# Patient Record
Sex: Male | Born: 1945 | Race: Black or African American | Hispanic: No | Marital: Married
Health system: Southern US, Community
[De-identification: ages and names within clinical notes are randomized; demographics above are authoritative.]

## PROBLEM LIST (undated history)

## (undated) DIAGNOSIS — M1990 Unspecified osteoarthritis, unspecified site: Secondary | ICD-10-CM

## (undated) DIAGNOSIS — Z2239 Carrier of other specified bacterial diseases: Secondary | ICD-10-CM

## (undated) DIAGNOSIS — I4891 Unspecified atrial fibrillation: Secondary | ICD-10-CM

## (undated) DIAGNOSIS — S88911A Complete traumatic amputation of right lower leg, level unspecified, initial encounter: Secondary | ICD-10-CM

## (undated) DIAGNOSIS — B1910 Unspecified viral hepatitis B without hepatic coma: Secondary | ICD-10-CM

## (undated) DIAGNOSIS — D649 Anemia, unspecified: Secondary | ICD-10-CM

## (undated) DIAGNOSIS — B1920 Unspecified viral hepatitis C without hepatic coma: Secondary | ICD-10-CM

## (undated) DIAGNOSIS — C801 Malignant (primary) neoplasm, unspecified: Secondary | ICD-10-CM

## (undated) DIAGNOSIS — I639 Cerebral infarction, unspecified: Secondary | ICD-10-CM

## (undated) DIAGNOSIS — E875 Hyperkalemia: Secondary | ICD-10-CM

## (undated) DIAGNOSIS — N189 Chronic kidney disease, unspecified: Secondary | ICD-10-CM

## (undated) DIAGNOSIS — I499 Cardiac arrhythmia, unspecified: Secondary | ICD-10-CM

## (undated) DIAGNOSIS — I509 Heart failure, unspecified: Secondary | ICD-10-CM

## (undated) DIAGNOSIS — I10 Essential (primary) hypertension: Secondary | ICD-10-CM

## (undated) DIAGNOSIS — I739 Peripheral vascular disease, unspecified: Secondary | ICD-10-CM

## (undated) DIAGNOSIS — K759 Inflammatory liver disease, unspecified: Secondary | ICD-10-CM

## (undated) DIAGNOSIS — E119 Type 2 diabetes mellitus without complications: Secondary | ICD-10-CM

## (undated) DIAGNOSIS — A4902 Methicillin resistant Staphylococcus aureus infection, unspecified site: Secondary | ICD-10-CM

## (undated) HISTORY — PX: CARDIAC DEFIBRILLATOR PLACEMENT: SHX171

## (undated) HISTORY — PX: ABOVE KNEE LEG AMPUTATION: SUR20

## (undated) HISTORY — PX: AV FISTULA PLACEMENT: SHX1204

## (undated) HISTORY — PX: LEG AMPUTATION BELOW KNEE: SHX694

## (undated) SURGERY — Surgical Case
Anesthesia: *Unknown

---

## 2014-10-17 ENCOUNTER — Ambulatory Visit (HOSPITAL_COMMUNITY)
Admission: AD | Admit: 2014-10-17 | Payer: Medicare Other | Source: Other Acute Inpatient Hospital | Admitting: Internal Medicine

## 2014-10-17 ENCOUNTER — Ambulatory Visit (HOSPITAL_COMMUNITY)
Admission: RE | Admit: 2014-10-17 | Discharge: 2014-10-17 | Disposition: A | Discharge status: Home / Self Care | Payer: Medicare Other | Source: Ambulatory Visit | Attending: Internal Medicine | Admitting: Internal Medicine

## 2014-10-17 ENCOUNTER — Other Ambulatory Visit (HOSPITAL_COMMUNITY): Payer: Self-pay

## 2014-10-17 DIAGNOSIS — R4182 Altered mental status, unspecified: Secondary | ICD-10-CM | POA: Diagnosis present

## 2014-10-17 DIAGNOSIS — G319 Degenerative disease of nervous system, unspecified: Secondary | ICD-10-CM | POA: Diagnosis not present

## 2014-12-16 ENCOUNTER — Encounter: Payer: Self-pay | Admitting: *Deleted

## 2014-12-16 ENCOUNTER — Encounter
Admission: RE | Disposition: A | Discharge status: Skilled Nursing Facility | Payer: Self-pay | Source: Ambulatory Visit | Attending: Vascular Surgery

## 2014-12-16 ENCOUNTER — Ambulatory Visit
Admission: RE | Admit: 2014-12-16 | Discharge: 2014-12-16 | Disposition: A | Discharge status: Skilled Nursing Facility | Payer: Medicare Other | Source: Ambulatory Visit | Attending: Vascular Surgery | Admitting: Vascular Surgery

## 2014-12-16 DIAGNOSIS — I120 Hypertensive chronic kidney disease with stage 5 chronic kidney disease or end stage renal disease: Secondary | ICD-10-CM | POA: Insufficient documentation

## 2014-12-16 DIAGNOSIS — T82858A Stenosis of vascular prosthetic devices, implants and grafts, initial encounter: Secondary | ICD-10-CM | POA: Diagnosis present

## 2014-12-16 DIAGNOSIS — E1122 Type 2 diabetes mellitus with diabetic chronic kidney disease: Secondary | ICD-10-CM | POA: Insufficient documentation

## 2014-12-16 DIAGNOSIS — I499 Cardiac arrhythmia, unspecified: Secondary | ICD-10-CM | POA: Insufficient documentation

## 2014-12-16 DIAGNOSIS — Z86718 Personal history of other venous thrombosis and embolism: Secondary | ICD-10-CM | POA: Diagnosis not present

## 2014-12-16 DIAGNOSIS — Z7982 Long term (current) use of aspirin: Secondary | ICD-10-CM | POA: Insufficient documentation

## 2014-12-16 DIAGNOSIS — Z992 Dependence on renal dialysis: Secondary | ICD-10-CM | POA: Diagnosis not present

## 2014-12-16 DIAGNOSIS — I509 Heart failure, unspecified: Secondary | ICD-10-CM | POA: Insufficient documentation

## 2014-12-16 DIAGNOSIS — T829XXA Unspecified complication of cardiac and vascular prosthetic device, implant and graft, initial encounter: Secondary | ICD-10-CM

## 2014-12-16 DIAGNOSIS — Z8673 Personal history of transient ischemic attack (TIA), and cerebral infarction without residual deficits: Secondary | ICD-10-CM | POA: Diagnosis not present

## 2014-12-16 DIAGNOSIS — Z79899 Other long term (current) drug therapy: Secondary | ICD-10-CM | POA: Insufficient documentation

## 2014-12-16 DIAGNOSIS — N186 End stage renal disease: Secondary | ICD-10-CM | POA: Insufficient documentation

## 2014-12-16 HISTORY — DX: Type 2 diabetes mellitus without complications: E11.9

## 2014-12-16 HISTORY — DX: Methicillin resistant Staphylococcus aureus infection, unspecified site: A49.02

## 2014-12-16 HISTORY — DX: Inflammatory liver disease, unspecified: K75.9

## 2014-12-16 HISTORY — DX: Peripheral vascular disease, unspecified: I73.9

## 2014-12-16 HISTORY — DX: Chronic kidney disease, unspecified: N18.9

## 2014-12-16 HISTORY — DX: Malignant (primary) neoplasm, unspecified: C80.1

## 2014-12-16 HISTORY — DX: Carrier of other specified bacterial diseases: Z22.39

## 2014-12-16 HISTORY — DX: Complete traumatic amputation of right lower leg, level unspecified, initial encounter: S88.911A

## 2014-12-16 HISTORY — DX: Cerebral infarction, unspecified: I63.9

## 2014-12-16 HISTORY — DX: Cardiac arrhythmia, unspecified: I49.9

## 2014-12-16 HISTORY — DX: Anemia, unspecified: D64.9

## 2014-12-16 HISTORY — PX: PERIPHERAL VASCULAR CATHETERIZATION: SHX172C

## 2014-12-16 LAB — GLUCOSE, CAPILLARY: Glucose-Capillary: 114 mg/dL — ABNORMAL HIGH (ref 65–99)

## 2014-12-16 LAB — POTASSIUM (ARMC VASCULAR LAB ONLY): POTASSIUM (ARMC VASCULAR LAB): 4.5

## 2014-12-16 SURGERY — DIALYSIS/PERMA CATHETER INSERTION
Anesthesia: Moderate Sedation | Laterality: Left

## 2014-12-16 SURGERY — A/V SHUNTOGRAM/FISTULAGRAM
Anesthesia: Moderate Sedation

## 2014-12-16 MED ORDER — PHENOL 1.4 % MT LIQD: 1.0000 | OROMUCOSAL | Status: DC | PRN

## 2014-12-16 MED ORDER — MIDAZOLAM HCL 5 MG/5ML IJ SOLN
INTRAMUSCULAR | Status: AC
  Filled 2014-12-16: qty 5

## 2014-12-16 MED ORDER — SODIUM CHLORIDE 0.9 % IV SOLN
INTRAVENOUS | Status: DC
  Administered 2014-12-16: 15:00:00 via INTRAVENOUS

## 2014-12-16 MED ORDER — IOHEXOL 300 MG/ML  SOLN
INTRAMUSCULAR | Status: DC | PRN
  Administered 2014-12-16: 30 mL via INTRAVENOUS

## 2014-12-16 MED ORDER — METOPROLOL TARTRATE 1 MG/ML IV SOLN: 2.0000 mg | INTRAVENOUS | Status: DC | PRN

## 2014-12-16 MED ORDER — GUAIFENESIN-DM 100-10 MG/5ML PO SYRP: 15.0000 mL | ORAL_SOLUTION | ORAL | Status: DC | PRN

## 2014-12-16 MED ORDER — ATROPINE SULFATE 0.1 MG/ML IJ SOLN: 0.5000 mg | Freq: Once | INTRAMUSCULAR | Status: DC | PRN

## 2014-12-16 MED ORDER — ONDANSETRON HCL 4 MG/2ML IJ SOLN: 4.0000 mg | Freq: Once | INTRAMUSCULAR | Status: DC | PRN

## 2014-12-16 MED ORDER — MIDAZOLAM HCL 2 MG/2ML IJ SOLN
INTRAMUSCULAR | Status: DC | PRN
  Administered 2014-12-16 (×2): 2 mg via INTRAVENOUS

## 2014-12-16 MED ORDER — LIDOCAINE HCL (PF) 1 % IJ SOLN
INTRAMUSCULAR | Status: DC | PRN
  Administered 2014-12-16: 10 mL

## 2014-12-16 MED ORDER — HEPARIN SODIUM (PORCINE) 1000 UNIT/ML IJ SOLN
INTRAMUSCULAR | Status: AC
  Filled 2014-12-16: qty 1

## 2014-12-16 MED ORDER — FENTANYL CITRATE (PF) 100 MCG/2ML IJ SOLN
INTRAMUSCULAR | Status: AC
  Filled 2014-12-16: qty 2

## 2014-12-16 MED ORDER — SODIUM CHLORIDE 0.9 % IV SOLN: 500.0000 mL | Freq: Once | INTRAVENOUS | Status: DC | PRN

## 2014-12-16 MED ORDER — ACETAMINOPHEN 325 MG RE SUPP: 325.0000 mg | RECTAL | Status: DC | PRN

## 2014-12-16 MED ORDER — FENTANYL CITRATE (PF) 100 MCG/2ML IJ SOLN
INTRAMUSCULAR | Status: DC | PRN
  Administered 2014-12-16 (×2): 50 ug via INTRAVENOUS

## 2014-12-16 MED ORDER — ACETAMINOPHEN 325 MG PO TABS: 325.0000 mg | ORAL_TABLET | ORAL | Status: DC | PRN

## 2014-12-16 MED ORDER — MORPHINE SULFATE 4 MG/ML IJ SOLN: 2.0000 mg | INTRAMUSCULAR | Status: DC | PRN

## 2014-12-16 MED ORDER — HEPARIN SODIUM (PORCINE) 10000 UNIT/ML IJ SOLN
INTRAMUSCULAR | Status: AC
  Filled 2014-12-16: qty 1

## 2014-12-16 MED ORDER — CEFAZOLIN SODIUM 1-5 GM-% IV SOLN
1.0000 g | Freq: Once | INTRAVENOUS | Status: AC
  Administered 2014-12-16: 1 g via INTRAVENOUS

## 2014-12-16 MED ORDER — OXYCODONE-ACETAMINOPHEN 5-325 MG PO TABS: 1.0000 | ORAL_TABLET | ORAL | Status: DC | PRN

## 2014-12-16 MED ORDER — HYDRALAZINE HCL 20 MG/ML IJ SOLN: 5.0000 mg | INTRAMUSCULAR | Status: DC | PRN

## 2014-12-16 MED ORDER — LABETALOL HCL 5 MG/ML IV SOLN: 10.0000 mg | INTRAVENOUS | Status: DC | PRN

## 2014-12-16 MED ORDER — ONDANSETRON HCL 4 MG/2ML IJ SOLN: 4.0000 mg | Freq: Four times a day (QID) | INTRAMUSCULAR | Status: DC | PRN

## 2014-12-16 MED ORDER — HYDROMORPHONE HCL 1 MG/ML IJ SOLN: 1.0000 mg | Freq: Once | INTRAMUSCULAR | Status: DC | PRN

## 2014-12-16 MED ORDER — LIDOCAINE-EPINEPHRINE (PF) 1 %-1:200000 IJ SOLN
INTRAMUSCULAR | Status: AC
  Filled 2014-12-16: qty 30

## 2014-12-16 MED ORDER — ALUM & MAG HYDROXIDE-SIMETH 200-200-20 MG/5ML PO SUSP: 15.0000 mL | ORAL | Status: DC | PRN

## 2014-12-16 MED ORDER — HEPARIN (PORCINE) IN NACL 2-0.9 UNIT/ML-% IJ SOLN
INTRAMUSCULAR | Status: AC
  Filled 2014-12-16: qty 1000

## 2014-12-16 SURGICAL SUPPLY — 12 items
BALLN DORADO 8X60X80 (BALLOONS) ×5
BALLN LUTONIX DCB 7X40X130 (BALLOONS) ×5
BALLOON DORADO 8X60X80 (BALLOONS) ×3 IMPLANT
BALLOON LUTONIX DCB 7X40X130 (BALLOONS) ×3 IMPLANT
CATH CANNON HEMO 15FR 32 (HEMODIALYSIS SUPPLIES) IMPLANT
CATH CANNON HEMO 15FR 32CM (HEMODIALYSIS SUPPLIES) ×5 IMPLANT
DEVICE PRESTO INFLATION (MISCELLANEOUS) ×3 IMPLANT
KIT 5FR STIFF NT/TG (MISCELLANEOUS) ×5 IMPLANT
PACK ANGIOGRAPHY (CUSTOM PROCEDURE TRAY) ×5 IMPLANT
SHEATH BRITE TIP 6FRX5.5 (SHEATH) ×5 IMPLANT
TOWEL OR 17X26 4PK STRL BLUE (TOWEL DISPOSABLE) ×5 IMPLANT
WIRE MAGIC TOR.035 180C (WIRE) ×5 IMPLANT

## 2014-12-16 NOTE — H&P (Signed)
[facility] History & Physical Update  The patient was interviewed and re-examined.  The patient's previous History and Physical has been reviewed and is unchanged.  There is no change in the plan of care.  Ina Scrivens, MD  12/16/2014, 1:42 PM

## 2014-12-16 NOTE — CV Procedure (Signed)
OPERATIVE NOTE    PRE-OPERATIVE DIAGNOSIS: 1. ESRD 2. Aneurysmal left brachiobasilic AV fistula that will require surgical revision and use of a tunneled catheter for several weeks while this heals  POST-OPERATIVE DIAGNOSIS: same as above  PROCEDURE: 1. Ultrasound guidance for vascular access to the left internal jugular vein 2. Fluoroscopic guidance for placement of catheter 3. Placement of a 27 cm tip to cuff tunneled hemodialysis catheter via the left internal jugular vein  SURGEON: Leotis Pain, MD  ANESTHESIA:  Local/MCS  ESTIMATED BLOOD LOSS: 25 cc  FINDING(S): 1.  Patent left internal jugular vein  SPECIMEN(S):  None  INDICATIONS:   Guy Brown is a 69 y.o. male who presents with an aneurysmal left arm AV fistula that will need a surgical revision. Fistulogram was also performed today as part of his evaluation for surgical revision.  The patient needs dialysis access for their ESRD while the surgical revision for his fistula is healing for several weeks, and a Permcath is necessary.  Risks and benefits are discussed and informed consent is obtained.    DESCRIPTION: After obtaining full informed written consent, the patient was brought back to the vascular suited. The patient's left neck and chest were sterilely prepped and draped in a sterile surgical field was created.  The left internal jugular vein was visualized with ultrasound and found to be patent. It was then accessed under direct ultrasound guidance and a permanent image was recorded. A wire was placed. After skin nick and dilatation, the peel-away sheath was placed over the wire. I then turned my attention to an area under the clavicle. Approximately 1-2 fingerbreadths below the clavicle a small counterincision was created and tunneled from the subclavicular incision to the access site. Using fluoroscopic guidance, a 27 centimeter tip to cuff tunneled hemodialysis catheter was selected, and tunneled from the  subclavicular incision to the access site. It was then placed through the peel-away sheath and the peel-away sheath was removed. Using fluoroscopic guidance the catheter tips were parked in the right atrium. The appropriate distal connectors were placed. It withdrew blood well and flushed easily with heparinized saline and a concentrated heparin solution was then placed. It was secured to the chest wall with 2 Prolene sutures. The access incision was closed single 4-0 Monocryl. A 4-0 Monocryl pursestring suture was placed around the exit site. Sterile dressings were placed. The patient tolerated the procedure well and was taken to the recovery room in stable condition.  COMPLICATIONS: None  CONDITION: Stable  [name]  12/16/2014, 4:31 PM

## 2014-12-16 NOTE — CV Procedure (Signed)
[facility]  OPERATIVE NOTE   PROCEDURE: 1.   Left brachiobasilic arteriovenous fistula cannulation under ultrasound guidance 2.   Left arm fistulagram including central venogram 3.   Percutaneous transluminal angioplasty of left basilic vein near basilic vein axillary vein confluence with 7 mm diameter drug-coated angioplasty balloon and 8 mm diameter high pressure angioplasty balloon  PRE-OPERATIVE DIAGNOSIS: 1. ESRD 2. Poorly functional and aneurysmal left brachiobasilic AVF  POST-OPERATIVE DIAGNOSIS: same as above   SURGEON: Leotis Pain, MD  ANESTHESIA: local with MCS  ESTIMATED BLOOD LOSS: 25 cc  FINDING(S): 1. 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 male who presents with malfunctioning  and aneurysmal left brachiobasilic arteriovenous fistula. He has had significant, life-threatening bleeding. The patient is scheduled for  left arm fistulagram and subsequent surgical revision. Fistulogram was performed to evaluate the fistula and to ensure that surgical revision will be safe and reasonable.  The patient is aware the risks include but are not limited to: bleeding, infection, thrombosis of the cannulated access, and possible anaphylactic reaction to the contrast.  The patient is aware of the risks of the procedure and elects to proceed forward.  DESCRIPTION: After full informed written consent was obtained, the patient was brought back to the angiography suite and placed supine upon the angiography table.  The patient was connected to monitoring equipment.  The  left arm was prepped and draped in the standard fashion for a percutaneous access intervention.  Under ultrasound guidance, the  left brachiobasilic arteriovenous fistula was cannulated with a micropuncture needle under direct ultrasound guidance in an antegrade  fashion near the anastomosis and a permanent image was performed.  The microwire was advanced into the fistula and the needle was exchanged for the a microsheath.  I then upsized to a 6 Fr Sheath and imaging was performed.  Hand injections were completed to image the access including the central venous system. This demonstrated an approximately 80% stenosis in the basilic vein just prior to a previously placed stent that appeared to be in the basilic vein axillary vein confluence.  Based on the images, this patient will need intervention for the stenosis  I then crossed the stenosis with a Magic Tourqe wire.  Based on the imaging, a 7 mm x 6 cm  Lutonix drug-coated angioplasty balloon was selected.  The balloon was centered around the basilic vein stenosis and inflated to 12 ATM for 1 minute(s). This resulted in about a 50% residual stenosis. I then upsized to an 8 mm diameter by 6 cm length high pressure angioplasty balloon inflated to 18 atm. On completion imaging, a 20 % residual stenosis was present.     Based on the completion imaging, no further intervention is necessary.  The wire and balloon were removed from the sheath.  A 4-0 Monocryl purse-string suture was sewn around the sheath.  The sheath was removed while tying down the suture.  A sterile bandage was applied to the puncture site.  COMPLICATIONS: None  CONDITION: Stable   [name]  12/16/2014 4:22 PM

## 2014-12-16 NOTE — Discharge Instructions (Signed)
Fistulogram, Care After °Refer to this sheet in the next few weeks. These instructions provide you with information on caring for yourself after your procedure. Your health care provider may also give you more specific instructions. Your treatment has been planned according to current medical practices, but problems sometimes occur. Call your health care provider if you have any problems or questions after your procedure. °WHAT TO EXPECT AFTER THE PROCEDURE °After your procedure, it is typical to have the following: °· A small amount of discomfort in the area where the catheters were placed. °· A small amount of bruising around the fistula. °· Sleepiness and fatigue. °HOME CARE INSTRUCTIONS °· Rest at home for the day following your procedure. °· Do not drive or operate heavy machinery while taking pain medicine. °· Take medicines only as directed by your health care provider. °· Do not take baths, swim, or use a hot tub until your health care provider approves. You may shower 24 hours after the procedure or as directed by your health care provider. °· There are many different ways to close and cover an incision, including stitches, skin glue, and adhesive strips. Follow your health care provider's instructions on: °¨ Incision care. °¨ Bandage (dressing) changes and removal. °¨ Incision closure removal. °· Monitor your dialysis fistula carefully. °SEEK MEDICAL CARE IF: °· You have drainage, redness, swelling, or pain at your catheter site. °· You have a fever. °· You have chills. °SEEK IMMEDIATE MEDICAL CARE IF: °· You feel weak. °· You have trouble balancing. °· You have trouble moving your arms or legs. °· You have problems with your speech or vision. °· You can no longer feel a vibration or buzz when you put your fingers over your dialysis fistula. °· The limb that was used for the procedure: °¨ Swells. °¨ Is painful. °¨ Is cold. °¨ Is discolored, such as blue or pale white. °Document Released: 11/12/2013  Document Reviewed: 08/17/2013 °Exit[employer]® Patient Information ©2015 [employer]. This information is not intended to replace advice given to you by your health care provider. Make sure you discuss any questions you have with your health care provider. ° °

## 2014-12-17 ENCOUNTER — Encounter: Payer: Self-pay | Admitting: Vascular Surgery

## 2015-01-20 ENCOUNTER — Inpatient Hospital Stay: Admission: RE | Admit: 2015-01-20 | Payer: Medicare Other | Source: Ambulatory Visit

## 2015-01-20 ENCOUNTER — Encounter: Payer: Self-pay | Admitting: *Deleted

## 2015-01-20 NOTE — Patient Instructions (Signed)
  Your procedure is scheduled on: 01-29-15 Report to [place] To find out your arrival time please call [phone] between 1PM - 3PM on 01-28-15 (TUESDAY)  Remember: Instructions that are not followed completely may result in serious medical risk, up to and including death, or upon the discretion of your surgeon and anesthesiologist your surgery may need to be rescheduled.    _X___ 1. Do not eat food or drink liquids after midnight. No gum chewing or hard candies.     _X___ 2. No Alcohol for 24 hours before or after surgery.   ____ 3. Bring all medications with you on the day of surgery if instructed.    _X___ 4. Notify your doctor if there is any change in your medical condition     (cold, fever, infections).     Do not wear jewelry, make-up, hairpins, clips or nail polish.  Do not wear lotions, powders, or perfumes. You may wear deodorant.  Do not shave 48 hours prior to surgery. Men may shave face and neck.  Do not bring valuables to the hospital.    [facility] is not responsible for any belongings or valuables.               Contacts, dentures or bridgework may not be worn into surgery.  Leave your suitcase in the car. After surgery it may be brought to your room.  For patients admitted to the hospital, discharge time is determined by your treatment team.   Patients discharged the day of surgery will not be allowed to drive home.   Please read over the following fact sheets that you were given:      __X__ Take these medicines the morning of surgery with A SIP OF WATER:    1. ARIPIPRAZOLE  2. SERTRALINE  3. GABAPENTIN  4. MIDODRINE-(PT NEEDS TO TAKE THIS IF THIS IS THE DAY HE WOULD BE TAKING IT)  5. PT MAY TAKE OXYCODONE AND LORAZEPAM IF NEEDED DAY OF SURGERY  6.  ____ Fleet Enema (as directed)   ____ Use CHG Soap as directed  ____ Use inhalers on the day of surgery  ____ Stop metformin 2 days prior to surgery    ____ Take 1/2  of usual insulin dose the night before surgery and none on the morning of surgery.   ____ Stop Coumadin/Plavix/aspirin-N/A  ____ Stop Anti-inflammatories-N/A   ____ Stop supplements until after surgery.    ____ Bring C-Pap to the hospital.

## 2015-01-28 DIAGNOSIS — Z7982 Long term (current) use of aspirin: Secondary | ICD-10-CM | POA: Diagnosis not present

## 2015-01-28 DIAGNOSIS — Z79899 Other long term (current) drug therapy: Secondary | ICD-10-CM | POA: Diagnosis not present

## 2015-01-28 DIAGNOSIS — Z8673 Personal history of transient ischemic attack (TIA), and cerebral infarction without residual deficits: Secondary | ICD-10-CM | POA: Diagnosis not present

## 2015-01-28 DIAGNOSIS — Z992 Dependence on renal dialysis: Secondary | ICD-10-CM | POA: Diagnosis not present

## 2015-01-28 DIAGNOSIS — X58XXXA Exposure to other specified factors, initial encounter: Secondary | ICD-10-CM | POA: Diagnosis not present

## 2015-01-28 DIAGNOSIS — Z833 Family history of diabetes mellitus: Secondary | ICD-10-CM | POA: Diagnosis not present

## 2015-01-28 DIAGNOSIS — I120 Hypertensive chronic kidney disease with stage 5 chronic kidney disease or end stage renal disease: Secondary | ICD-10-CM | POA: Diagnosis not present

## 2015-01-28 DIAGNOSIS — N186 End stage renal disease: Secondary | ICD-10-CM | POA: Diagnosis present

## 2015-01-28 DIAGNOSIS — Z9889 Other specified postprocedural states: Secondary | ICD-10-CM | POA: Diagnosis not present

## 2015-01-28 DIAGNOSIS — Z86718 Personal history of other venous thrombosis and embolism: Secondary | ICD-10-CM | POA: Diagnosis not present

## 2015-01-28 DIAGNOSIS — Z6841 Body Mass Index (BMI) 40.0 and over, adult: Secondary | ICD-10-CM | POA: Diagnosis not present

## 2015-01-28 DIAGNOSIS — E119 Type 2 diabetes mellitus without complications: Secondary | ICD-10-CM | POA: Diagnosis not present

## 2015-01-28 DIAGNOSIS — Z8249 Family history of ischemic heart disease and other diseases of the circulatory system: Secondary | ICD-10-CM | POA: Diagnosis not present

## 2015-01-28 DIAGNOSIS — T82898A Other specified complication of vascular prosthetic devices, implants and grafts, initial encounter: Secondary | ICD-10-CM | POA: Diagnosis not present

## 2015-01-28 DIAGNOSIS — I509 Heart failure, unspecified: Secondary | ICD-10-CM | POA: Diagnosis not present

## 2015-01-28 DIAGNOSIS — Z79891 Long term (current) use of opiate analgesic: Secondary | ICD-10-CM | POA: Diagnosis not present

## 2015-01-29 ENCOUNTER — Encounter: Payer: Self-pay | Admitting: Anesthesiology

## 2015-01-29 ENCOUNTER — Ambulatory Visit: Payer: Medicare Other | Admitting: Anesthesiology

## 2015-01-29 ENCOUNTER — Ambulatory Visit
Admission: RE | Admit: 2015-01-29 | Discharge: 2015-01-29 | Payer: Medicare Other | Source: Ambulatory Visit | Attending: Vascular Surgery | Admitting: Vascular Surgery

## 2015-01-29 ENCOUNTER — Encounter
Admission: RE | Disposition: A | Discharge status: Other Acute Inpatient Hospital | Payer: Self-pay | Source: Ambulatory Visit | Attending: Vascular Surgery

## 2015-01-29 DIAGNOSIS — Z8249 Family history of ischemic heart disease and other diseases of the circulatory system: Secondary | ICD-10-CM | POA: Insufficient documentation

## 2015-01-29 DIAGNOSIS — Z992 Dependence on renal dialysis: Secondary | ICD-10-CM | POA: Insufficient documentation

## 2015-01-29 DIAGNOSIS — Z9889 Other specified postprocedural states: Secondary | ICD-10-CM | POA: Insufficient documentation

## 2015-01-29 DIAGNOSIS — I120 Hypertensive chronic kidney disease with stage 5 chronic kidney disease or end stage renal disease: Secondary | ICD-10-CM | POA: Insufficient documentation

## 2015-01-29 DIAGNOSIS — Z6841 Body Mass Index (BMI) 40.0 and over, adult: Secondary | ICD-10-CM | POA: Insufficient documentation

## 2015-01-29 DIAGNOSIS — N186 End stage renal disease: Secondary | ICD-10-CM | POA: Insufficient documentation

## 2015-01-29 DIAGNOSIS — Z8673 Personal history of transient ischemic attack (TIA), and cerebral infarction without residual deficits: Secondary | ICD-10-CM | POA: Insufficient documentation

## 2015-01-29 DIAGNOSIS — I509 Heart failure, unspecified: Secondary | ICD-10-CM | POA: Insufficient documentation

## 2015-01-29 DIAGNOSIS — Z79899 Other long term (current) drug therapy: Secondary | ICD-10-CM | POA: Insufficient documentation

## 2015-01-29 DIAGNOSIS — Y832 Surgical operation with anastomosis, bypass or graft as the cause of abnormal reaction of the patient, or of later complication, without mention of misadventure at the time of the procedure: Secondary | ICD-10-CM | POA: Insufficient documentation

## 2015-01-29 DIAGNOSIS — Z7982 Long term (current) use of aspirin: Secondary | ICD-10-CM | POA: Insufficient documentation

## 2015-01-29 DIAGNOSIS — Z86718 Personal history of other venous thrombosis and embolism: Secondary | ICD-10-CM | POA: Insufficient documentation

## 2015-01-29 DIAGNOSIS — Z833 Family history of diabetes mellitus: Secondary | ICD-10-CM | POA: Insufficient documentation

## 2015-01-29 DIAGNOSIS — T82898A Other specified complication of vascular prosthetic devices, implants and grafts, initial encounter: Secondary | ICD-10-CM | POA: Insufficient documentation

## 2015-01-29 DIAGNOSIS — Z79891 Long term (current) use of opiate analgesic: Secondary | ICD-10-CM | POA: Insufficient documentation

## 2015-01-29 DIAGNOSIS — E1122 Type 2 diabetes mellitus with diabetic chronic kidney disease: Secondary | ICD-10-CM | POA: Insufficient documentation

## 2015-01-29 HISTORY — DX: Hyperkalemia: E87.5

## 2015-01-29 HISTORY — PX: AV FISTULA PLACEMENT: SHX1204

## 2015-01-29 HISTORY — DX: Heart failure, unspecified: I50.9

## 2015-01-29 HISTORY — DX: Morbid (severe) obesity due to excess calories: E66.01

## 2015-01-29 HISTORY — DX: Unspecified osteoarthritis, unspecified site: M19.90

## 2015-01-29 LAB — GLUCOSE, CAPILLARY
GLUCOSE-CAPILLARY: 87 mg/dL (ref 65–99)
GLUCOSE-CAPILLARY: 108 mg/dL — AB (ref 65–99)

## 2015-01-29 LAB — PROTIME-INR
INR: 1.26
Prothrombin Time: 16 s — ABNORMAL HIGH (ref 11.4–15.0)

## 2015-01-29 LAB — POTASSIUM: POTASSIUM: 4.6 mmol/L (ref 3.5–5.1)

## 2015-01-29 SURGERY — ARTERIOVENOUS (AV) FISTULA CREATION
Anesthesia: General | Laterality: Left | Wound class: Clean

## 2015-01-29 MED ORDER — HYDROCODONE-ACETAMINOPHEN 5-325 MG PO TABS: 1.0000 | ORAL_TABLET | Freq: Four times a day (QID) | ORAL | Status: AC | PRN

## 2015-01-29 MED ORDER — HEPARIN SODIUM (PORCINE) 5000 UNIT/ML IJ SOLN
INTRAMUSCULAR | Status: AC
  Filled 2015-01-29: qty 1

## 2015-01-29 MED ORDER — MIDAZOLAM HCL 2 MG/2ML IJ SOLN
INTRAMUSCULAR | Status: DC | PRN
  Administered 2015-01-29: 1 mg via INTRAVENOUS

## 2015-01-29 MED ORDER — BUPIVACAINE-EPINEPHRINE (PF) 0.5% -1:200000 IJ SOLN
INTRAMUSCULAR | Status: AC
  Filled 2015-01-29: qty 30

## 2015-01-29 MED ORDER — SODIUM CHLORIDE 0.9 % IV SOLN
INTRAVENOUS | Status: DC | PRN
  Administered 2015-01-29: 100 mL via INTRAMUSCULAR

## 2015-01-29 MED ORDER — FENTANYL CITRATE (PF) 100 MCG/2ML IJ SOLN
INTRAMUSCULAR | Status: DC | PRN
  Administered 2015-01-29: 50 ug via INTRAVENOUS

## 2015-01-29 MED ORDER — SODIUM CHLORIDE 0.9 % IJ SOLN
INTRAMUSCULAR | Status: AC
  Filled 2015-01-29: qty 100

## 2015-01-29 MED ORDER — LIDOCAINE HCL (CARDIAC) 20 MG/ML IV SOLN
INTRAVENOUS | Status: DC | PRN
  Administered 2015-01-29: 40 mg via INTRAVENOUS

## 2015-01-29 MED ORDER — VASOPRESSIN 20 UNIT/ML IV SOLN
INTRAVENOUS | Status: DC | PRN
  Administered 2015-01-29 (×5): 2 [IU] via INTRAVENOUS

## 2015-01-29 MED ORDER — GLYCOPYRROLATE 0.2 MG/ML IJ SOLN
INTRAMUSCULAR | Status: DC | PRN
  Administered 2015-01-29: 0.2 mg via INTRAVENOUS

## 2015-01-29 MED ORDER — FAMOTIDINE 20 MG PO TABS
ORAL_TABLET | ORAL | Status: AC
  Administered 2015-01-29: 20 mg via ORAL
  Filled 2015-01-29: qty 1

## 2015-01-29 MED ORDER — SODIUM CHLORIDE 0.9 % IJ SOLN
INTRAMUSCULAR | Status: AC
  Filled 2015-01-29: qty 20

## 2015-01-29 MED ORDER — ONDANSETRON HCL 4 MG/2ML IJ SOLN: 4.0000 mg | Freq: Once | INTRAMUSCULAR | Status: DC | PRN

## 2015-01-29 MED ORDER — FENTANYL CITRATE (PF) 100 MCG/2ML IJ SOLN: 25.0000 ug | INTRAMUSCULAR | Status: DC | PRN

## 2015-01-29 MED ORDER — CEFAZOLIN SODIUM 1-5 GM-% IV SOLN
1.0000 g | Freq: Once | INTRAVENOUS | Status: AC
  Administered 2015-01-29: 1 g via INTRAVENOUS

## 2015-01-29 MED ORDER — HEPARIN SODIUM (PORCINE) 1000 UNIT/ML IJ SOLN
INTRAMUSCULAR | Status: DC | PRN
  Administered 2015-01-29: 3000 [IU] via INTRAVENOUS

## 2015-01-29 MED ORDER — FAMOTIDINE 20 MG PO TABS
20.0000 mg | ORAL_TABLET | Freq: Once | ORAL | Status: AC
  Administered 2015-01-29: 20 mg via ORAL

## 2015-01-29 MED ORDER — SUCCINYLCHOLINE CHLORIDE 20 MG/ML IJ SOLN
INTRAMUSCULAR | Status: DC | PRN
  Administered 2015-01-29: 120 mg via INTRAVENOUS

## 2015-01-29 MED ORDER — EPHEDRINE SULFATE 50 MG/ML IJ SOLN
INTRAMUSCULAR | Status: DC | PRN
  Administered 2015-01-29: 10 mg via INTRAVENOUS

## 2015-01-29 MED ORDER — PHENYLEPHRINE HCL 10 MG/ML IJ SOLN
INTRAMUSCULAR | Status: DC | PRN
  Administered 2015-01-29: 200 ug via INTRAVENOUS
  Administered 2015-01-29: 100 ug via INTRAVENOUS
  Administered 2015-01-29 (×2): 200 ug via INTRAVENOUS

## 2015-01-29 MED ORDER — SODIUM CHLORIDE 0.9 % IV SOLN
Freq: Once | INTRAVENOUS | Status: AC
  Administered 2015-01-29: 12:00:00 via INTRAVENOUS

## 2015-01-29 MED ORDER — CEFAZOLIN SODIUM 1-5 GM-% IV SOLN
INTRAVENOUS | Status: AC
  Administered 2015-01-29: 1 g via INTRAVENOUS
  Filled 2015-01-29: qty 50

## 2015-01-29 MED ORDER — PROPOFOL 10 MG/ML IV BOLUS
INTRAVENOUS | Status: DC | PRN
  Administered 2015-01-29: 140 mg via INTRAVENOUS
  Administered 2015-01-29 (×2): 20 mg via INTRAVENOUS

## 2015-01-29 MED ORDER — SODIUM CHLORIDE 0.9 % IV SOLN
INTRAVENOUS | Status: DC | PRN
Start: 2015-01-29 — End: 2015-01-29
  Administered 2015-01-29: 13:00:00 via INTRAVENOUS

## 2015-01-29 SURGICAL SUPPLY — 55 items
BAG DECANTER STRL (MISCELLANEOUS) ×3 IMPLANT
BLADE SURG SZ11 CARB STEEL (BLADE) ×3 IMPLANT
BOOT SUTURE AID YELLOW STND (SUTURE) ×3 IMPLANT
BRUSH SCRUB 4% CHG (MISCELLANEOUS) ×13 IMPLANT
CANISTER SUCT 1200ML W/VALVE (MISCELLANEOUS) ×3 IMPLANT
CHLORAPREP W/TINT 26ML (MISCELLANEOUS) ×3 IMPLANT
CLIP SPRNG 6 S-JAW DBL (CLIP) ×1 IMPLANT
CLIP SPRNG 6MM S-JAW DBL (CLIP) ×3
DECANTER SPIKE VIAL GLASS SM (MISCELLANEOUS) ×2 IMPLANT
ELECT CAUTERY BLADE 6.4 (BLADE) ×3 IMPLANT
EVICEL 2ML SEALANT HUMAN (Miscellaneous) ×2 IMPLANT
GEL ULTRASOUND 20GR AQUASONIC (MISCELLANEOUS) IMPLANT
GLOVE BIO SURGEON STRL SZ7 (GLOVE) ×3 IMPLANT
GOWN STRL REUS W/ TWL LRG LVL3 (GOWN DISPOSABLE) ×1 IMPLANT
GOWN STRL REUS W/ TWL XL LVL3 (GOWN DISPOSABLE) ×3 IMPLANT
GOWN STRL REUS W/TWL LRG LVL3 (GOWN DISPOSABLE) ×3
GOWN STRL REUS W/TWL XL LVL3 (GOWN DISPOSABLE) ×9
GRAFT COLLAGEN VASCULAR 8X40 (Vascular Products) ×2 IMPLANT
HEMOSTAT SURGICEL 2X3 (HEMOSTASIS) ×5 IMPLANT
IV NS 500ML (IV SOLUTION) ×3
IV NS 500ML BAXH (IV SOLUTION) ×1 IMPLANT
KIT RM TURNOVER STRD PROC AR (KITS) ×3 IMPLANT
LABEL OR SOLS (LABEL) ×3 IMPLANT
LIQUID BAND (GAUZE/BANDAGES/DRESSINGS) ×3 IMPLANT
LOOP RED MAXI  1X406MM (MISCELLANEOUS) ×2
LOOP VESSEL MAXI 1X406 RED (MISCELLANEOUS) ×1 IMPLANT
LOOP VESSEL MINI 0.8X406 BLUE (MISCELLANEOUS) ×1 IMPLANT
LOOPS BLUE MINI 0.8X406MM (MISCELLANEOUS) ×2
NDL FILTER BLUNT 18X1 1/2 (NEEDLE) ×1 IMPLANT
NDL HYPO 30X.5 LL (NEEDLE) IMPLANT
NEEDLE FILTER BLUNT 18X 1/2SAF (NEEDLE) ×4
NEEDLE FILTER BLUNT 18X1 1/2 (NEEDLE) ×2 IMPLANT
NEEDLE HYPO 30X.5 LL (NEEDLE) IMPLANT
NS IRRIG 500ML POUR BTL (IV SOLUTION) ×3 IMPLANT
PACK EXTREMITY ARMC (MISCELLANEOUS) ×3 IMPLANT
PAD GROUND ADULT SPLIT (MISCELLANEOUS) ×3 IMPLANT
PAD PREP 24X41 OB/GYN DISP (PERSONAL CARE ITEMS) ×3 IMPLANT
SOLUTION CELL SAVER (CLIP) ×1 IMPLANT
STOCKINETTE STRL 4IN 9604848 (GAUZE/BANDAGES/DRESSINGS) ×3 IMPLANT
SUT MNCRL AB 4-0 PS2 18 (SUTURE) ×3 IMPLANT
SUT PROLENE 6 0 BV (SUTURE) ×14 IMPLANT
SUT SILK 2 0 (SUTURE) ×3
SUT SILK 2 0 SH (SUTURE) ×2 IMPLANT
SUT SILK 2-0 18XBRD TIE 12 (SUTURE) ×1 IMPLANT
SUT SILK 3 0 (SUTURE) ×3
SUT SILK 3-0 18XBRD TIE 12 (SUTURE) ×1 IMPLANT
SUT SILK 4 0 (SUTURE) ×3
SUT SILK 4-0 18XBRD TIE 12 (SUTURE) ×1 IMPLANT
SUT VIC AB 3-0 SH 27 (SUTURE) ×6
SUT VIC AB 3-0 SH 27X BRD (SUTURE) ×2 IMPLANT
SYR 20CC LL (SYRINGE) ×3 IMPLANT
SYR 3ML LL SCALE MARK (SYRINGE) ×3 IMPLANT
SYR TB 1ML 27GX1/2 LL (SYRINGE) IMPLANT
SYRINGE IRR TOOMEY STRL 70CC (SYRINGE) ×2 IMPLANT
TOWEL OR 17X26 4PK STRL BLUE (TOWEL DISPOSABLE) IMPLANT

## 2015-01-29 NOTE — H&P (Signed)
[facility] History & Physical Update  The patient was interviewed and re-examined.  The patient's previous History and Physical has been reviewed and is unchanged.  There is no change in the plan of care. We plan to proceed with the scheduled procedure.  Baneza Bartoszek, MD  01/29/2015, 12:17 PM

## 2015-01-29 NOTE — Op Note (Signed)
[facility]   OPERATIVE NOTE   PROCEDURE: 1. Jump graft revision  Left brachiobasilic arteriovenous fistula with 7 mm Artegraft 2. Ligation of left brachiobasilic AV fistula  PRE-OPERATIVE DIAGNOSIS: 1. aneurysmal degeneration of arteriovenous fistula  2. ESRD  POST-OPERATIVE DIAGNOSIS: same as above   SURGEON: Leotis Pain, MD  ASSISTANT(S): Dr. Hortencia Pilar  ANESTHESIA: General  ESTIMATED BLOOD LOSS: 50 cc  FINDING(S): 1. Left brachiobasilic AVF aneurysm 2.  palpable thrill at end of the case  SPECIMEN(S):  none  INDICATIONS:   Guy Brown is a 69 y.o. male who  presents with aneurysmal degeneration of left arm arteriovenous access.  In order to salvage the fistula and decrease the bleeding complication risks, I recommended a jump graft revision with ligation of the access.  Risk, benefits, and alternatives to access surgery were discussed.  The patient is aware the risks include but are not limited to: bleeding, infection, steal syndrome, nerve damage, ischemic monomelic neuropathy, loss of the access, need for additional procedures, death and stroke.  The patient agrees to proceed forward with the procedure.  DESCRIPTION: After obtaining full informed written consent, the patient was brought back to the operating room and placed supine upon the operating table.  The patient received IV antibiotics prior to induction.  After obtaining adequate anesthesia, the patient was prepped and draped in the standard fashion for the access procedure.  As incision was created near the arterial anastomosis prior to the aneurysmal segment.  The access was encircled with vessel loops and prepared for control.  I then created an incision in the proximal arm beyond the aneurysmal segment and encircled the access there for control with a vessel loop.  I then used the tunneller and tunnelled between the two incisions around the old access.  I brought a 63mm Artegraft  through the tunneller making sure to avoid twisting after marking for orientation.  The patient was then given 3000 units of intravenous heparin.  The access was then controlled and clamped and ligated distally with a silk suture ligature. I evacuated the blood from the aneurysmal portions and then ligated it to reduce pressure. I prepared the end nearer the original arterial anastomosis for an anastomosis with the new Artegraft.  The anastomosis was created in an end to end fashion with two 6-0 Prolene sutures in the typical fashion.  I then flushed through the new graft and prepared this for the distal anastomosis.  The access was then divided and ligated again with a silk suture ligature.  The distal end was then prepared for anastomosis with the new Artegraft.  An anastomosis was then created with two 6-0 Prolene sutures in the usual fashion.  The graft was flushed and de-aired prior to release of control.  Patch sutures with 6-0 Prolene sutures were used as needed for control of bleeding.  Surgicel and Evicel topical hemostatic agents were placed and hemostasis was complete.  I then closed the wound with 3-0 Vicryl suture in the subcutaneous space and a 4-0 Monocryl suture was used to close the skin.  Dermabond was placed as a dressing.  The patient was then awakened from anesthesia and taken to the recovery room in stable condition having tolerated the procedure well.    COMPLICATIONS: none  CONDITION: stable  DEW,JASON  01/29/2015, 3:37 PM

## 2015-01-29 NOTE — Discharge Instructions (Signed)
Call or contact our office with fever >101, wound redness or drainage, severe pain, or other issues No heavy lifting one week   AMBULATORY SURGERY  DISCHARGE INSTRUCTIONS   1) The drugs that you were given will stay in your system until tomorrow so for the next 24 hours you should not:  A) Drive an automobile B) Make any legal decisions C) Drink any alcoholic beverage   2) You may resume regular meals tomorrow.  Today it is better to start with liquids and gradually work up to solid foods.  You may eat anything you prefer, but it is better to start with liquids, then soup and crackers, and gradually work up to solid foods.   3) Please notify your doctor immediately if you have any unusual bleeding, trouble breathing, redness and pain at the surgery site, drainage, fever, or pain not relieved by medication.    4) Additional Instructions:        Please contact your physician with any problems or Same Day Surgery at [phone], Monday through Friday 6 am to 4 pm, or [place] [facility] number at [phone].

## 2015-01-29 NOTE — Anesthesia Preprocedure Evaluation (Addendum)
Anesthesia Evaluation  Patient identified by MRN, date of birth, ID band Patient awake    Reviewed: Allergy & Precautions, NPO status , Patient's Chart, lab work & pertinent test results, reviewed documented beta blocker date and time   Airway Mallampati: IV  TM Distance: >3 FB     Dental  (+) Chipped, Missing, Poor Dentition, Loose   Pulmonary          Cardiovascular + Peripheral Vascular Disease and +CHF + dysrhythmias Atrial Fibrillation     Neuro/Psych CVA    GI/Hepatic (+) Hepatitis -  Endo/Other  diabetes, Type 2  Renal/GU Renal disease     Musculoskeletal  (+) Arthritis -,   Abdominal   Peds  Hematology  (+) anemia ,   Anesthesia Other Findings Pt is weak on the L side.  Reproductive/Obstetrics                           Anesthesia Physical Anesthesia Plan  ASA: IV  Anesthesia Plan: General   Post-op Pain Management:    Induction: Intravenous  Airway Management Planned: Oral ETT  Additional Equipment:   Intra-op Plan:   Post-operative Plan:   Informed Consent: I have reviewed the patients History and Physical, chart, labs and discussed the procedure including the risks, benefits and alternatives for the proposed anesthesia with the patient or authorized representative who has indicated his/her understanding and acceptance.     Plan Discussed with: CRNA  Anesthesia Plan Comments:        Anesthesia Quick Evaluation

## 2015-01-29 NOTE — Transfer of Care (Signed)
Immediate Anesthesia Transfer of Care Note  Patient: Guy Brown  Procedure(s) Performed: Procedure(s): Revision of left arm AV fistula with artegraft, ligation of aneursymal fistula (Left)  Patient Location: PACU  Anesthesia Type:General  Level of Consciousness: awake  Airway & Oxygen Therapy: Patient Spontanous Breathing and Patient connected to face mask oxygen  Post-op Assessment: Report given to RN and Post -op Vital signs reviewed and stable  Post vital signs: Reviewed and stable  Last Vitals:  Filed Vitals:   01/29/15 1542  BP: 108/59  Pulse: 70  Temp: 37.4 C  Resp: 14    Complications: No apparent anesthesia complications

## 2015-02-01 NOTE — Anesthesia Postprocedure Evaluation (Signed)
  Anesthesia Post-op Note  Patient: Guy Brown  Procedure(s) Performed: Procedure(s): Revision of left arm AV fistula with artegraft, ligation of aneursymal fistula (Left)  Anesthesia type:General  Patient location: PACU  Post pain: Pain level controlled  Post assessment: Post-op Vital signs reviewed, Patient's Cardiovascular Status Stable, Respiratory Function Stable, Patent Airway and No signs of Nausea or vomiting  Post vital signs: Reviewed and stable  Last Vitals:  Filed Vitals:   01/29/15 1656  BP: 104/47  Pulse: 70  Temp: 37.8 C  Resp: 15    Level of consciousness: awake, alert  and patient cooperative  Complications: No apparent anesthesia complications

## 2015-02-02 ENCOUNTER — Other Ambulatory Visit (HOSPITAL_COMMUNITY): Payer: Self-pay

## 2015-02-02 ENCOUNTER — Ambulatory Visit (HOSPITAL_COMMUNITY)
Admission: RE | Admit: 2015-02-02 | Discharge: 2015-02-02 | Disposition: A | Discharge status: Home / Self Care | Payer: Medicare Other | Source: Ambulatory Visit | Attending: Vascular Surgery | Admitting: Vascular Surgery

## 2015-02-02 ENCOUNTER — Observation Stay (HOSPITAL_COMMUNITY)
Admission: RE | Admit: 2015-02-02 | Payer: Medicare Other | Source: Ambulatory Visit | Attending: *Deleted | Admitting: *Deleted

## 2015-02-02 DIAGNOSIS — R4182 Altered mental status, unspecified: Secondary | ICD-10-CM

## 2015-02-06 NOTE — Addendum Note (Signed)
Addendum  created 02/06/15 0700 by Gunnar Bulla, MD   Modules edited: Anesthesia Attestations

## 2015-05-26 ENCOUNTER — Other Ambulatory Visit: Payer: Self-pay | Admitting: Vascular Surgery

## 2015-06-03 ENCOUNTER — Encounter
Admission: RE | Disposition: A | Discharge status: Skilled Nursing Facility | Payer: Self-pay | Source: Ambulatory Visit | Attending: Vascular Surgery

## 2015-06-03 ENCOUNTER — Ambulatory Visit
Admission: RE | Admit: 2015-06-03 | Discharge: 2015-06-03 | Disposition: A | Discharge status: Skilled Nursing Facility | Payer: Medicare Other | Source: Ambulatory Visit | Attending: Vascular Surgery | Admitting: Vascular Surgery

## 2015-06-03 ENCOUNTER — Encounter: Payer: Self-pay | Admitting: *Deleted

## 2015-06-03 DIAGNOSIS — T82858A Stenosis of vascular prosthetic devices, implants and grafts, initial encounter: Secondary | ICD-10-CM | POA: Insufficient documentation

## 2015-06-03 DIAGNOSIS — N186 End stage renal disease: Secondary | ICD-10-CM | POA: Insufficient documentation

## 2015-06-03 DIAGNOSIS — Y832 Surgical operation with anastomosis, bypass or graft as the cause of abnormal reaction of the patient, or of later complication, without mention of misadventure at the time of the procedure: Secondary | ICD-10-CM | POA: Insufficient documentation

## 2015-06-03 DIAGNOSIS — Z7982 Long term (current) use of aspirin: Secondary | ICD-10-CM | POA: Diagnosis not present

## 2015-06-03 DIAGNOSIS — Z79899 Other long term (current) drug therapy: Secondary | ICD-10-CM | POA: Diagnosis not present

## 2015-06-03 DIAGNOSIS — E1122 Type 2 diabetes mellitus with diabetic chronic kidney disease: Secondary | ICD-10-CM | POA: Insufficient documentation

## 2015-06-03 DIAGNOSIS — I499 Cardiac arrhythmia, unspecified: Secondary | ICD-10-CM | POA: Insufficient documentation

## 2015-06-03 DIAGNOSIS — Z992 Dependence on renal dialysis: Secondary | ICD-10-CM | POA: Insufficient documentation

## 2015-06-03 DIAGNOSIS — M25519 Pain in unspecified shoulder: Secondary | ICD-10-CM

## 2015-06-03 DIAGNOSIS — I509 Heart failure, unspecified: Secondary | ICD-10-CM | POA: Insufficient documentation

## 2015-06-03 HISTORY — DX: Unspecified viral hepatitis B without hepatic coma: B19.10

## 2015-06-03 HISTORY — DX: Unspecified osteoarthritis, unspecified site: M19.90

## 2015-06-03 HISTORY — DX: Essential (primary) hypertension: I10

## 2015-06-03 HISTORY — DX: Unspecified viral hepatitis C without hepatic coma: B19.20

## 2015-06-03 HISTORY — DX: Unspecified atrial fibrillation: I48.91

## 2015-06-03 HISTORY — PX: PERIPHERAL VASCULAR CATHETERIZATION: SHX172C

## 2015-06-03 LAB — POTASSIUM: Potassium: 3.9 mmol/L (ref 3.5–5.1)

## 2015-06-03 SURGERY — A/V SHUNTOGRAM/FISTULAGRAM
Anesthesia: Moderate Sedation

## 2015-06-03 MED ORDER — INSULIN ASPART 100 UNIT/ML ~~LOC~~ SOLN: 0.0000 [IU] | SUBCUTANEOUS | Status: DC

## 2015-06-03 MED ORDER — ACETAMINOPHEN 325 MG PO TABS: 325.0000 mg | ORAL_TABLET | ORAL | Status: DC | PRN

## 2015-06-03 MED ORDER — OXYCODONE HCL 5 MG PO TABS: 5.0000 mg | ORAL_TABLET | ORAL | Status: DC | PRN

## 2015-06-03 MED ORDER — HEPARIN SODIUM (PORCINE) 1000 UNIT/ML IJ SOLN
INTRAMUSCULAR | Status: AC
  Filled 2015-06-03: qty 1

## 2015-06-03 MED ORDER — ACETAMINOPHEN 325 MG RE SUPP: 325.0000 mg | RECTAL | Status: DC | PRN

## 2015-06-03 MED ORDER — MIDAZOLAM HCL 2 MG/2ML IJ SOLN
INTRAMUSCULAR | Status: AC
  Filled 2015-06-03: qty 4

## 2015-06-03 MED ORDER — DEXTROSE 5 % IV SOLN
1.5000 g | INTRAVENOUS | Status: AC
  Administered 2015-06-03: 1.5 g via INTRAVENOUS
  Filled 2015-06-03: qty 1.5

## 2015-06-03 MED ORDER — FENTANYL CITRATE (PF) 100 MCG/2ML IJ SOLN
INTRAMUSCULAR | Status: AC
  Filled 2015-06-03: qty 2

## 2015-06-03 MED ORDER — HEPARIN SODIUM (PORCINE) 1000 UNIT/ML IJ SOLN
INTRAMUSCULAR | Status: DC | PRN
  Administered 2015-06-03: 3000 [IU] via INTRAVENOUS

## 2015-06-03 MED ORDER — SODIUM CHLORIDE 0.9 % IV SOLN
INTRAVENOUS | Status: DC
  Administered 2015-06-03: 15:00:00 via INTRAVENOUS

## 2015-06-03 MED ORDER — FENTANYL CITRATE (PF) 100 MCG/2ML IJ SOLN
INTRAMUSCULAR | Status: DC | PRN
  Administered 2015-06-03 (×2): 50 ug via INTRAVENOUS

## 2015-06-03 MED ORDER — ALUM & MAG HYDROXIDE-SIMETH 200-200-20 MG/5ML PO SUSP: 15.0000 mL | ORAL | Status: DC | PRN

## 2015-06-03 MED ORDER — HYDROMORPHONE HCL 1 MG/ML IJ SOLN: 0.5000 mg | INTRAMUSCULAR | Status: DC | PRN

## 2015-06-03 MED ORDER — MIDAZOLAM HCL 2 MG/2ML IJ SOLN
INTRAMUSCULAR | Status: DC | PRN
  Administered 2015-06-03: 1 mg via INTRAVENOUS

## 2015-06-03 MED ORDER — IOHEXOL 300 MG/ML  SOLN
INTRAMUSCULAR | Status: DC | PRN
  Administered 2015-06-03: 20 mL via INTRAVENOUS

## 2015-06-03 MED ORDER — ONDANSETRON HCL 4 MG/2ML IJ SOLN: 4.0000 mg | Freq: Four times a day (QID) | INTRAMUSCULAR | Status: DC | PRN

## 2015-06-03 SURGICAL SUPPLY — 16 items
ADH SKN CLS APL DERMABOND .7 (GAUZE/BANDAGES/DRESSINGS) ×2
BALLN DORADO 8X20X80 (BALLOONS) ×4
BALLOON DORADO 8X20X80 (BALLOONS) IMPLANT
DERMABOND ADVANCED (GAUZE/BANDAGES/DRESSINGS) ×2
DERMABOND ADVANCED .7 DNX12 (GAUZE/BANDAGES/DRESSINGS) IMPLANT
DEVICE PRESTO INFLATION (MISCELLANEOUS) ×2 IMPLANT
DRAPE BRACHIAL (DRAPES) ×2 IMPLANT
PACK ANGIOGRAPHY (CUSTOM PROCEDURE TRAY) ×2 IMPLANT
SET INTRO CAPELLA COAXIAL (SET/KITS/TRAYS/PACK) ×2 IMPLANT
SHEATH BRITE TIP 6FRX5.5 (SHEATH) ×2 IMPLANT
SHEATH BRITE TIP 7FRX5.5 (SHEATH) ×2 IMPLANT
STENT VIABAHN 8X50X120 (Permanent Stent) ×3 IMPLANT
SUT MNCRL AB 4-0 PS2 18 (SUTURE) ×2 IMPLANT
TOWEL OR 17X26 4PK STRL BLUE (TOWEL DISPOSABLE) ×2 IMPLANT
WIRE G 018X200 V18 (WIRE) ×2 IMPLANT
WIRE J 3MM .035X145CM (WIRE) ×3 IMPLANT

## 2015-06-03 NOTE — H&P (Signed)
[facility] History & Physical Update  The patient was interviewed and re-examined.  The patient's previous History and Physical has been reviewed and is unchanged.  There is no change in the plan of care. We plan to proceed with the scheduled procedure.  Guy Brown, Dolores Lory, MD  06/03/2015, 5:03 PM

## 2015-06-03 NOTE — OR Nursing (Signed)
[facility] received [facility] [place] for a fistulagram patient is a resident of [facility] and is confused.  Report received from snf phone numbers received for patient's family members to obtain informed consent for procedure.  Attempts to contact family are unsuccessful.  I called [facility] to see if they have any more phone numbers for Guy Brown's family and they gave me one more for his daughter.  Again contact unsuccessful.  Informed dr. Delana Meyer of the above along with phone conversation i had with the doctor at [facility] who stated the patient has no problems with hemodialysis using his current access.  Will attempt to contact family until 3pm then if unsuccessful with have patient sent back to [facility].  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  This information was not on the transfer form nor was it passed along in report to ambulance drivers.   Currently awaiting family's return phone call to obtain consent for procedure.

## 2015-06-03 NOTE — Op Note (Signed)
OPERATIVE NOTE   PROCEDURE: 1. Contrast injection left arm brachial axillary dialysis graft 2. Percutaneous transluminal and plasty and stent placement venous anastomosis left arm brachial axillary dialysis graft  PRE-OPERATIVE DIAGNOSIS: Complication of dialysis access                                                       End Stage Renal Disease  POST-OPERATIVE DIAGNOSIS: same as above   SURGEON: Katha Cabal, M.D.  ANESTHESIA: Conscious Sedation   ESTIMATED BLOOD LOSS: minimal  FINDING(S): 1. Napkin-ring like stenosis at the venous anastomosis  SPECIMEN(S):  None  CONTRAST: 20 cc  FLUOROSCOPY TIME: 1.6 minutes  INDICATIONS: Guy Brown is a 69 y.o. male who  presents with malfunctioning left arm brachial axillary AV access.  The patient is scheduled for angiography with possible intervention of the AV access.  The patient is aware the risks include but are not limited to: bleeding, infection, thrombosis of the cannulated access, and possible anaphylactic reaction to the contrast.  The patient acknowledges if the access can not be salvaged a tunneled catheter will be needed and will be placed during this procedure.  The patient is aware of the risks of the procedure and elects to proceed with the angiogram and intervention.  DESCRIPTION: After full informed written consent was obtained, the patient was brought back to the Special Procedure suite and placed supine position.  Appropriate cardiopulmonary monitors were placed.  The left arm was prepped and draped in the standard fashion.  Appropriate timeout is called. The left brachial axillary dialysis graft  was cannulated with a micropuncture needle under ultrasound guidance. Graft is noted to be echolucent and compressible indicating patency. Images recorded for the permanent record.  The microwire was advanced and the needle was exchanged for  a microsheath.  The J-wire was then advanced and a 6 Fr sheath inserted.  Hand  injections were completed to image the access from the arterial anastomosis through the entire access.  The central venous structures were also imaged by hand injections.  Based on the images,  there is a greater than 80% napkin-like ring associated with the venous anastomosis. It is just proximal to a previously placed stent. Therefore 3000 units of heparin was given and allowed to circulate for several minutes. A J-wire was then advanced through the 6 French sheath and the sheath exchanged for a 7 [place] sheath. A VAT wire was then advanced into the central venous system and subsequently a 8 x 50 Viabahn stent was deployed across the lesion. It was postdilated with an 8 x 20 [place] balloon several inflations were made along the course of the stent all to 14-16 atm. Follow-up imaging demonstrated excellent result with a well opposed stent no significant residual stenosis    A 4-0 Monocryl purse-string suture was sewn around the sheath.  The sheath was removed and light pressure was applied.  A sterile bandage was applied to the puncture site.    COMPLICATIONS: None  CONDITION: Improved  Katha Cabal, M.D [facility] Office: [phone]  06/03/2015 4:39 PM

## 2015-06-04 ENCOUNTER — Encounter: Payer: Self-pay | Admitting: Vascular Surgery

## 2016-05-12 DEATH — deceased

## 2016-08-10 IMAGING — CT CT HEAD W/O CM
1 series · 16 of 30 positions shown, 20 images · non-contrast
Comparison: None.

CLINICAL DATA: Mental status change

EXAM:
CT HEAD WITHOUT CONTRAST
TECHNIQUE: Contiguous axial images were obtained from the base of the skull
through the vertex without intravenous contrast.

[Series 2: head 5.0 h30s · axial · 0.41mm/px · z∈[+1404,+1549]mm · 16 of 33 slices shown, 20 images]
[im 2/33  brain]
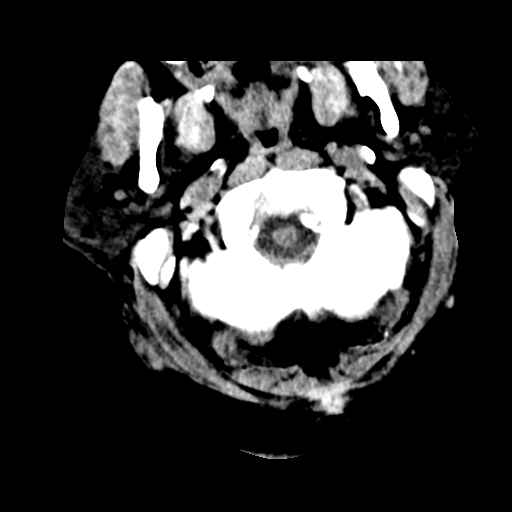
[im 2/33  bone]
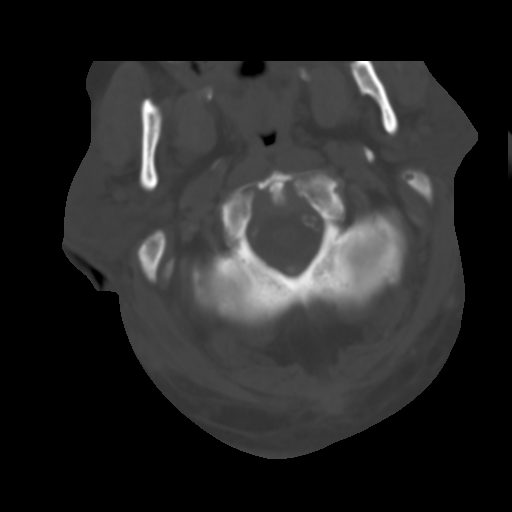
[im 4/33  brain]
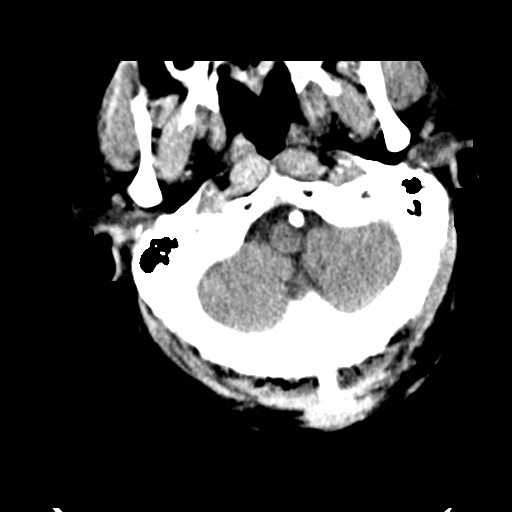
[im 6/33  brain]
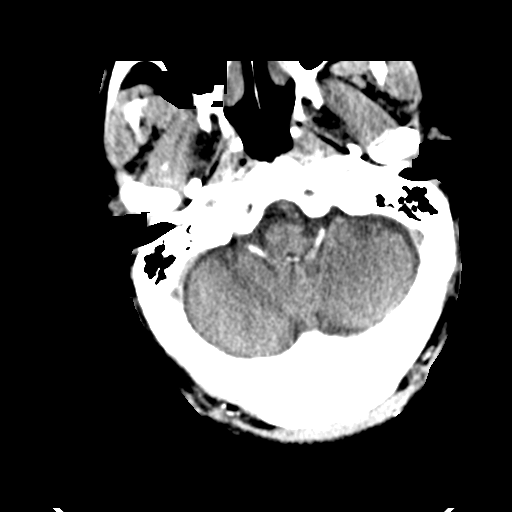
[im 8/33  brain]
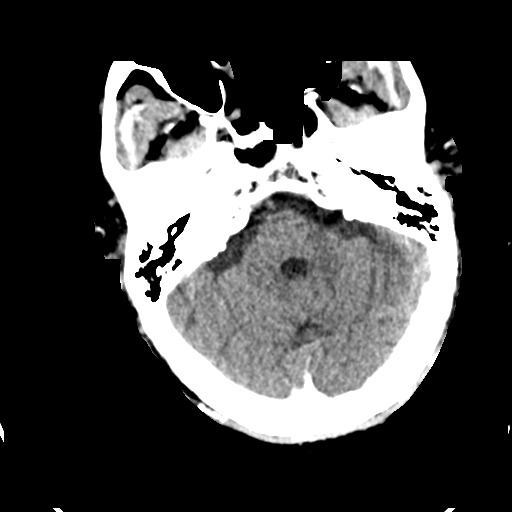
[im 9/33  brain]
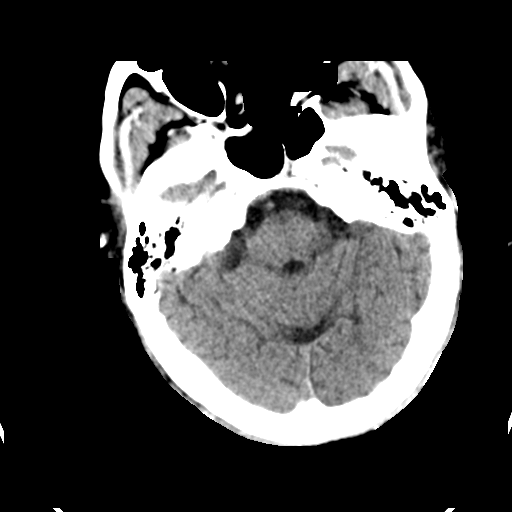
[im 9/33  bone]
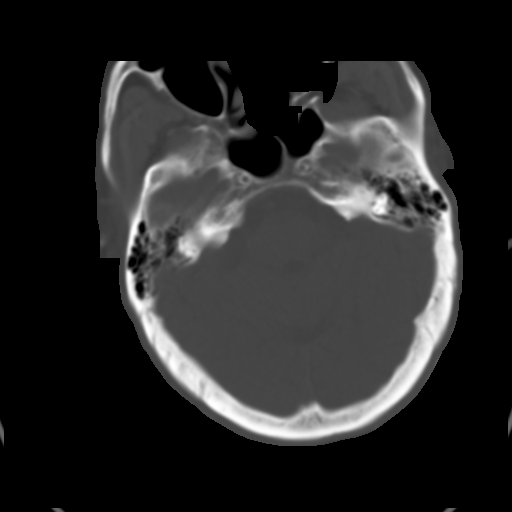
[im 12/33  brain]
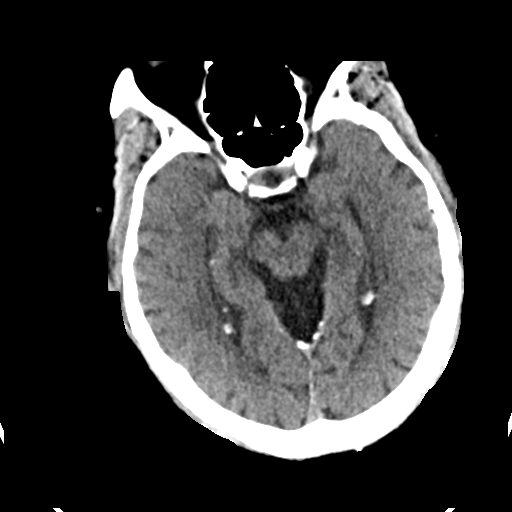
[im 14/33  brain]
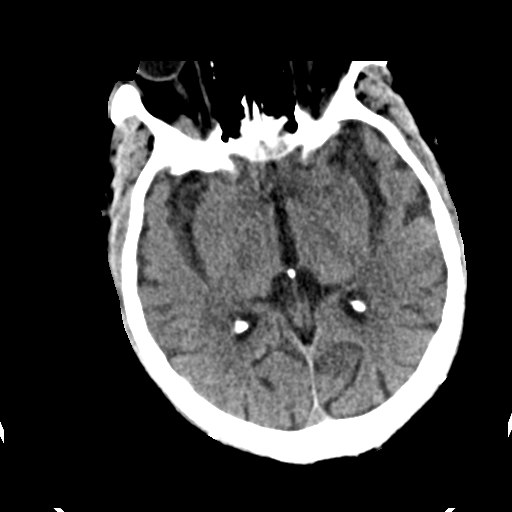
[im 16/33  brain]
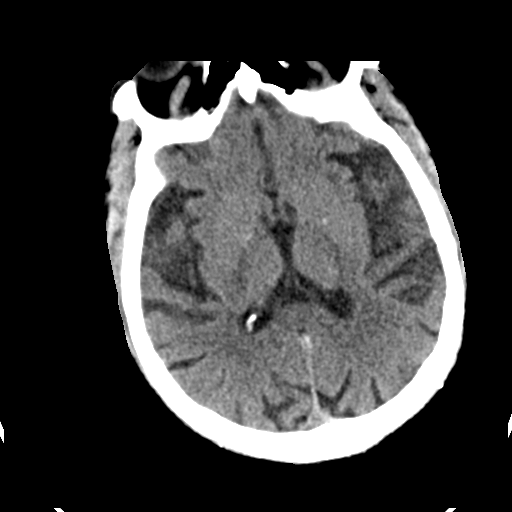
[im 17/33  brain]
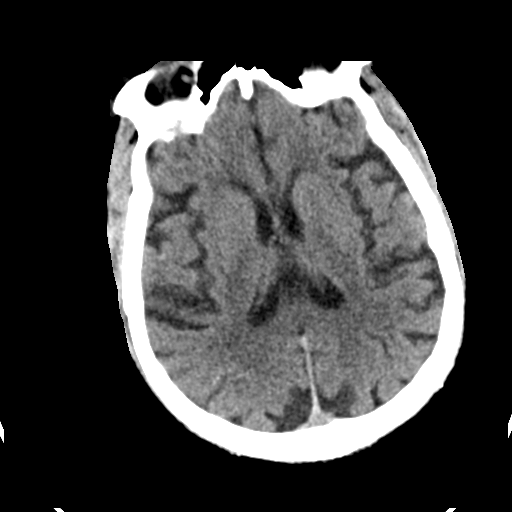
[im 17/33  bone]
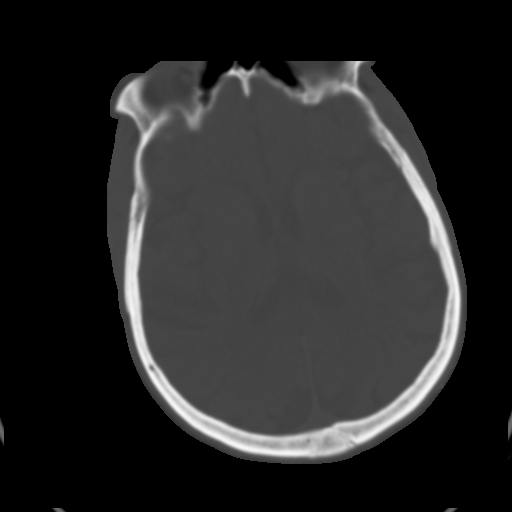
[im 19/33  brain]
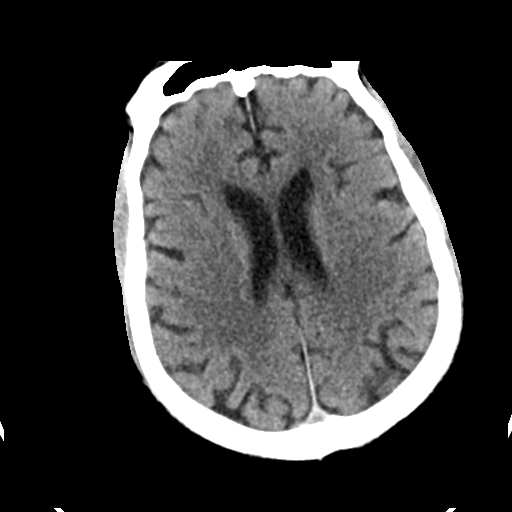
[im 21/33  brain]
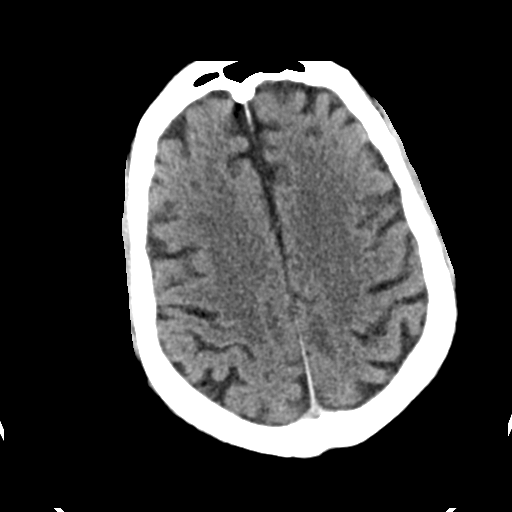
[im 24/33  brain]
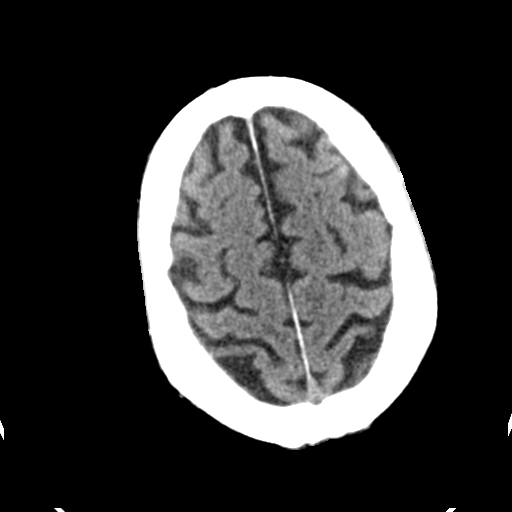
[im 25/33  brain]
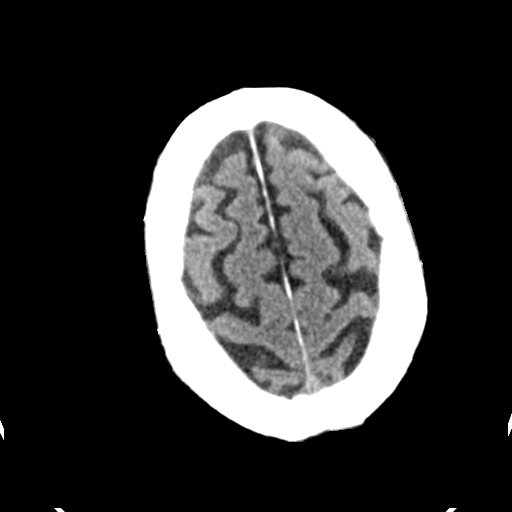
[im 25/33  bone]
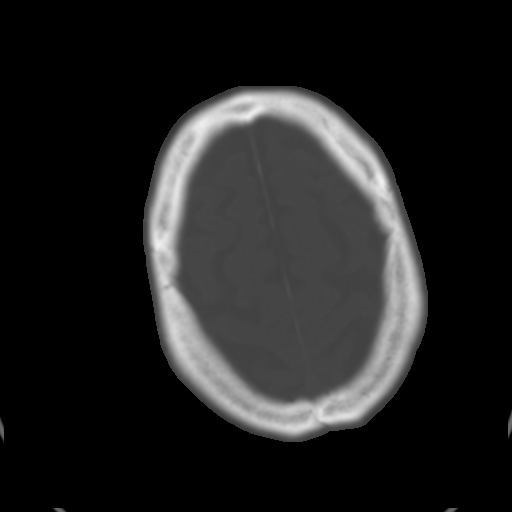
[im 27/33  brain]
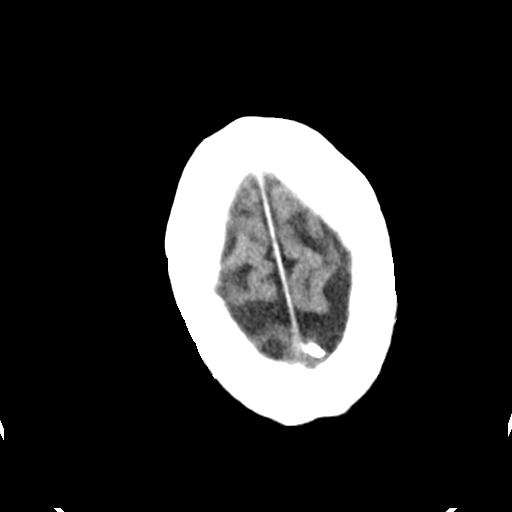
[im 29/33  brain]
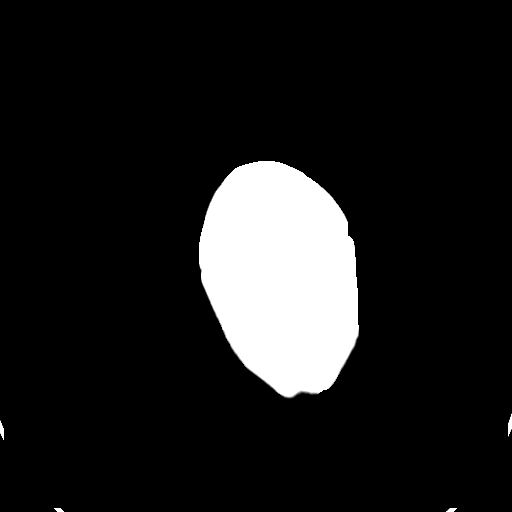
[im 31/33  brain]
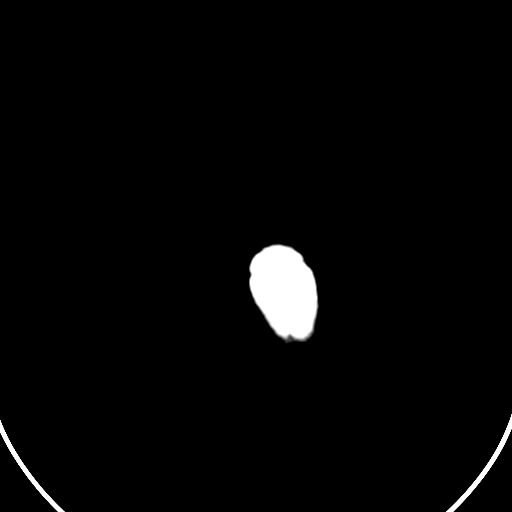

[16 of 30 positions shown; findings below may reference images not displayed]

FINDINGS: The ventricular system is slightly prominent, and the cortical sulci
are diffusely prominent consistent with atrophy. The septum is
midline in position. Mild small vessel ischemic change is noted in
the periventricular white matter. No hemorrhage, mass lesion, or
acute infarction is seen. On bone window images, no calvarial
abnormality is seen. The paranasal sinuses that are visualized are
well pneumatized.
IMPRESSION: Atrophy. Mild small vessel ischemic change. No acute intracranial
abnormality.

## 2016-09-03 ENCOUNTER — Encounter: Payer: Self-pay | Admitting: Vascular Surgery

## 2016-11-26 IMAGING — CT CT HEAD W/O CM
1 series · 16 of 30 positions shown, 20 images · non-contrast
Comparison: 10/17/2014

CLINICAL DATA: Altered mental status.

EXAM:
CT HEAD WITHOUT CONTRAST
TECHNIQUE: Contiguous axial images were obtained from the base of the skull
through the vertex without intravenous contrast.

[Series 2: head 5.0 h30s · axial · 0.49mm/px · z∈[-114,+41]mm · 16 of 35 slices shown, 20 images]
[im 2/35  brain]
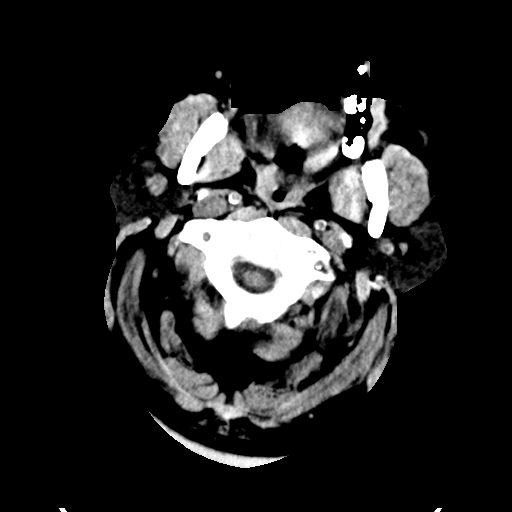
[im 2/35  bone]
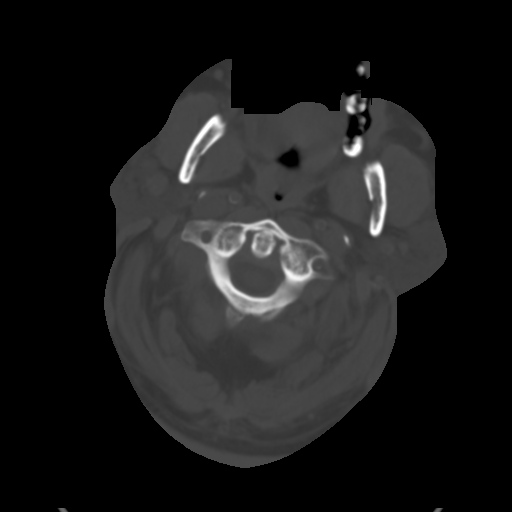
[im 4/35  brain]
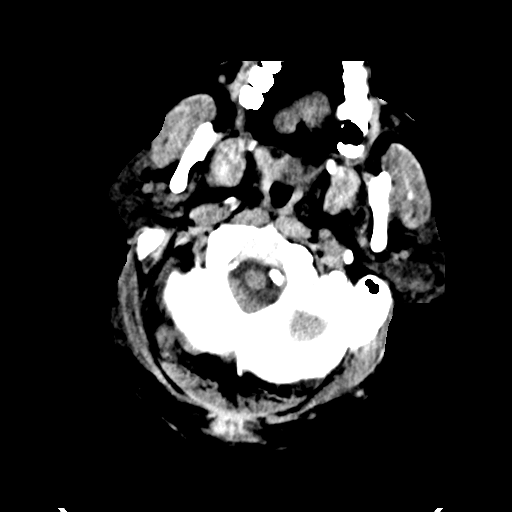
[im 6/35  brain]
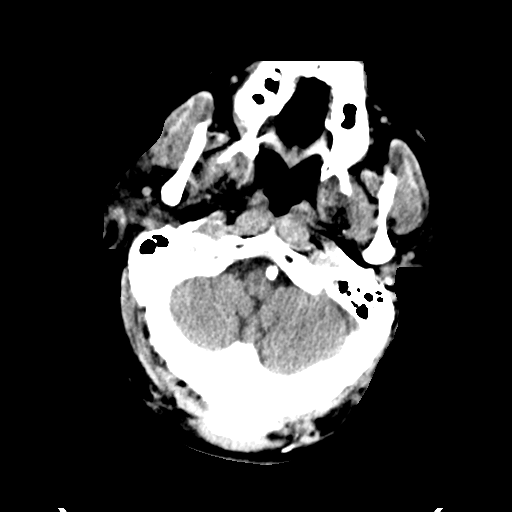
[im 9/35  brain]
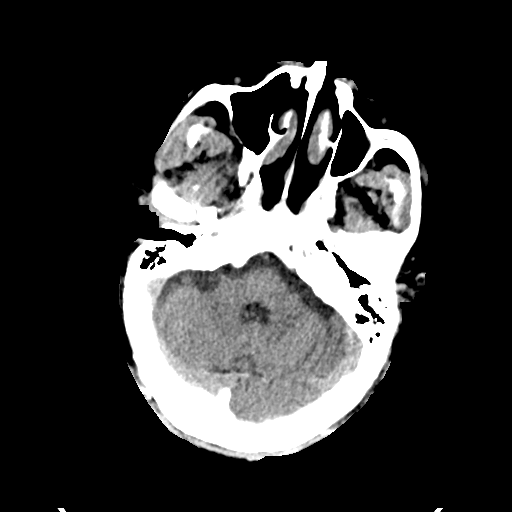
[im 10/35  brain]
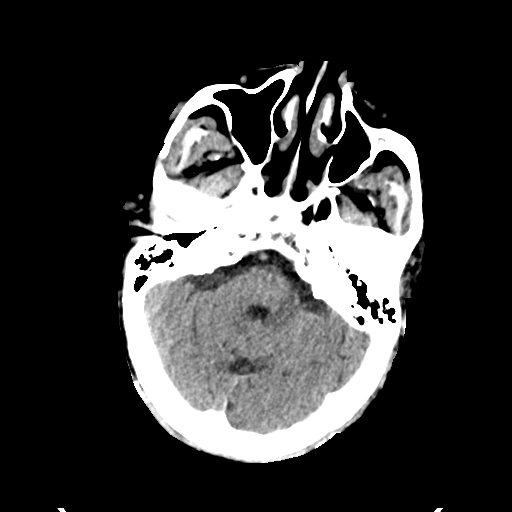
[im 10/35  bone]
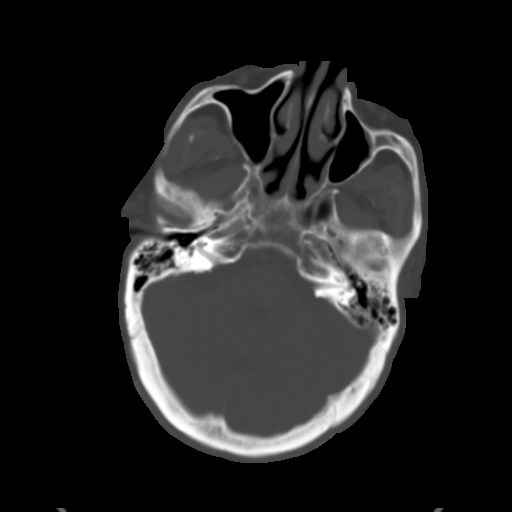
[im 12/35  brain]
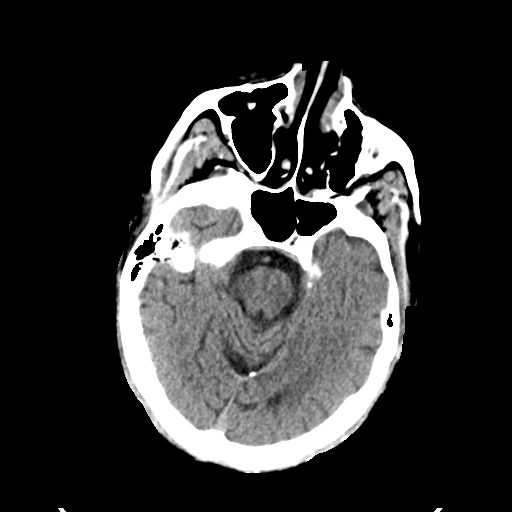
[im 15/35  brain]
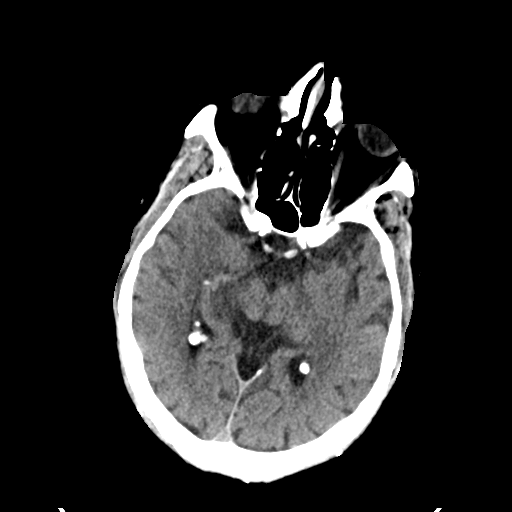
[im 17/35  brain]
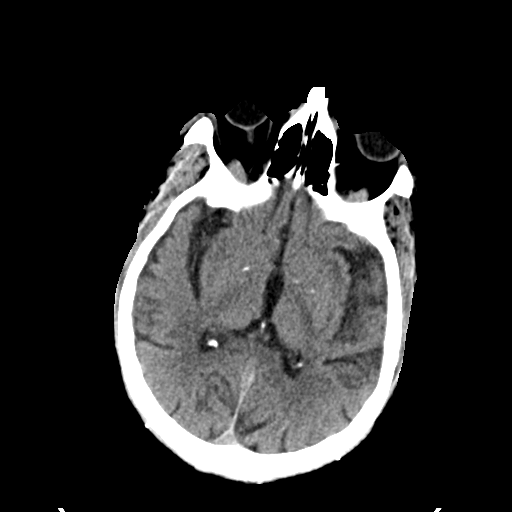
[im 18/35  brain]
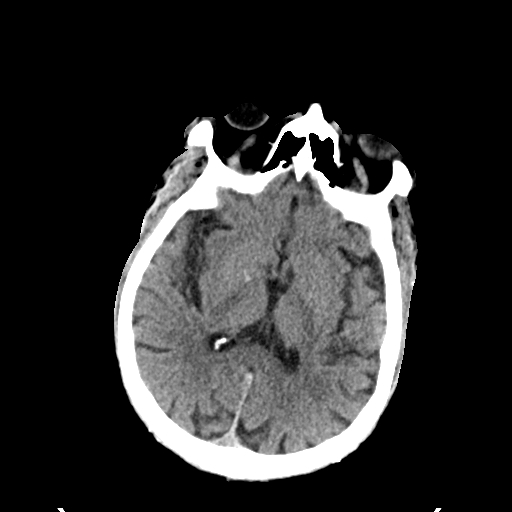
[im 18/35  bone]
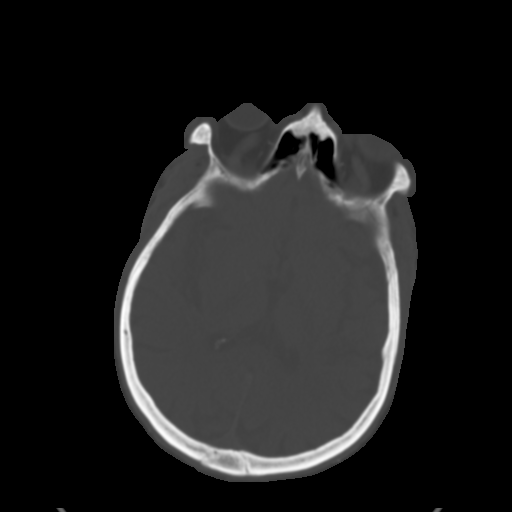
[im 20/35  brain]
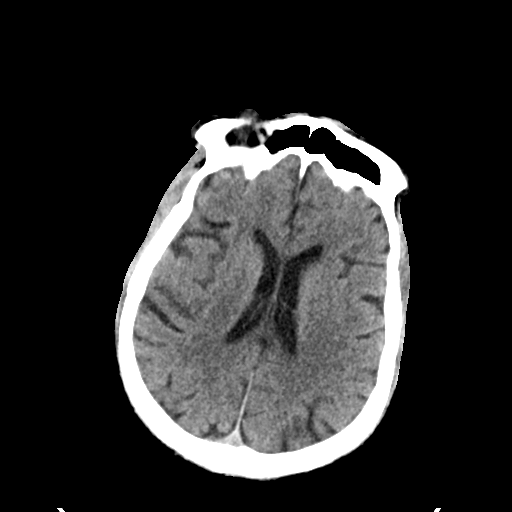
[im 23/35  brain]
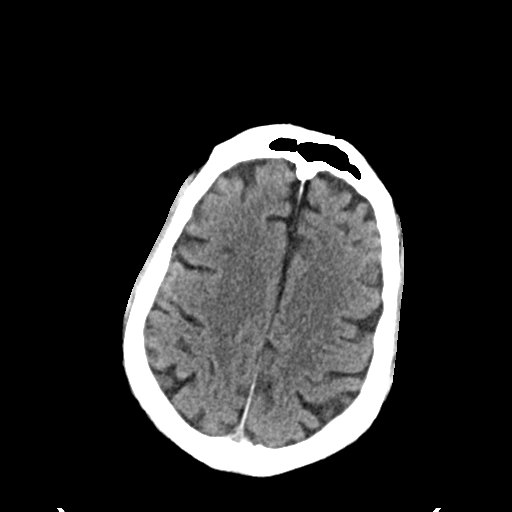
[im 25/35  brain]
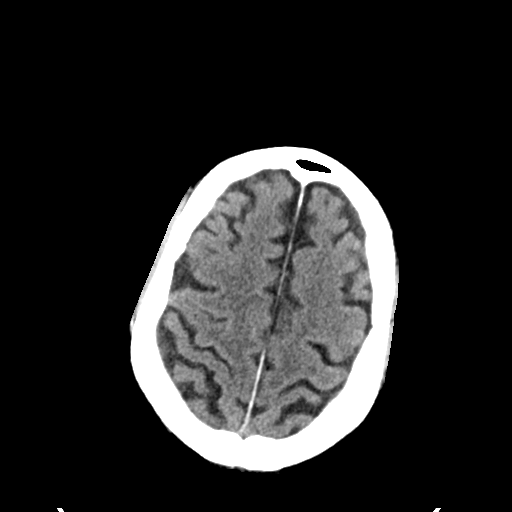
[im 26/35  brain]
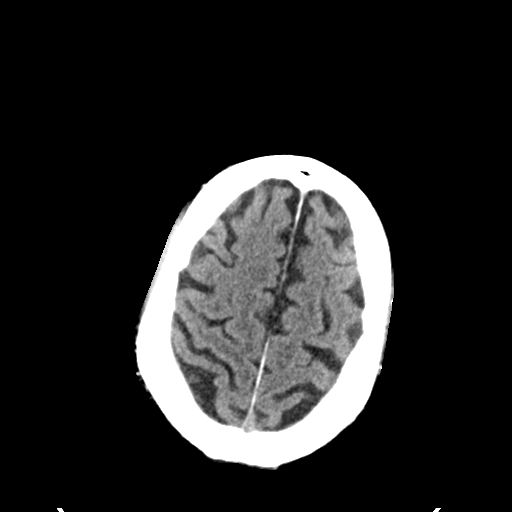
[im 26/35  bone]
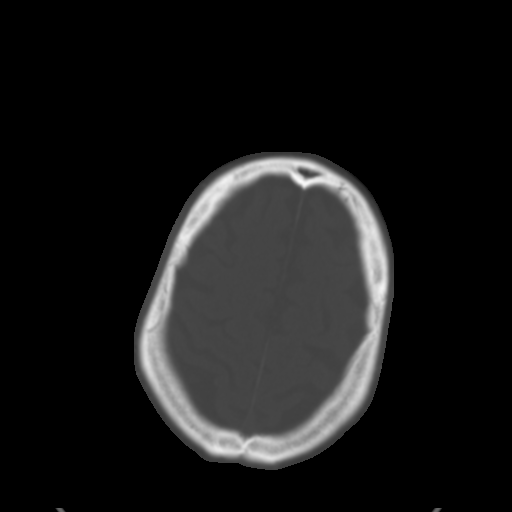
[im 29/35  brain]
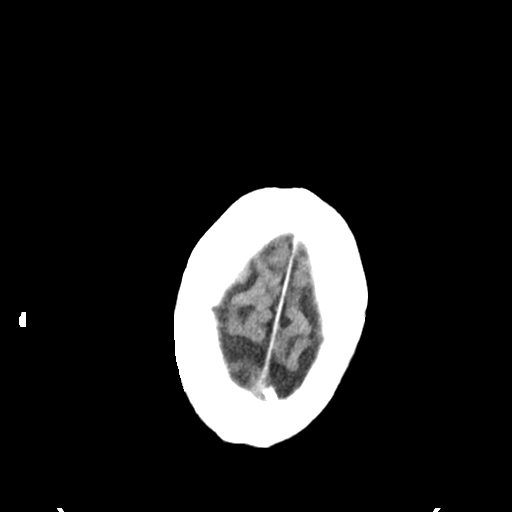
[im 31/35  brain]
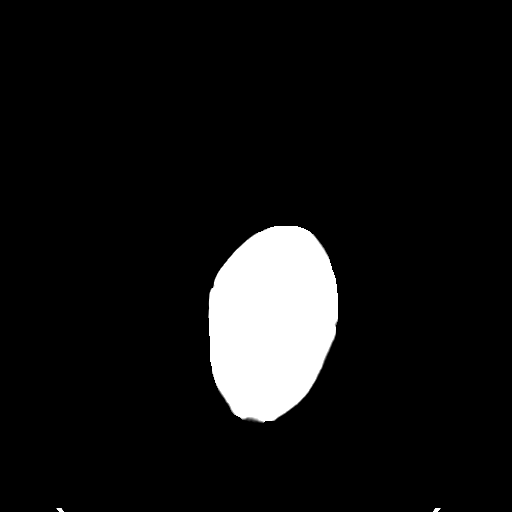
[im 33/35  brain]
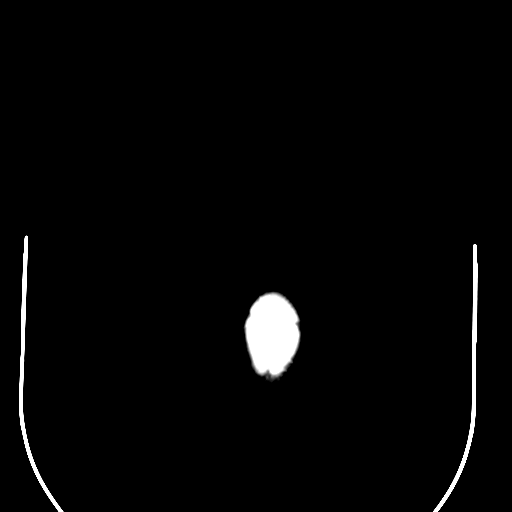

[16 of 30 positions shown; findings below may reference images not displayed]

FINDINGS: Mild cerebral atrophy is unchanged. There is no evidence of acute
cortical infarct, intracranial hemorrhage, mass, midline shift, or
extra-axial fluid collection.

Orbits are unremarkable. There are small left and trace right
mastoid effusions, slightly increased from prior. Minimal left
maxillary sinus mucosal thickening is new.
IMPRESSION: No evidence of acute intracranial abnormality.
